# Patient Record
Sex: Female | Born: 1980 | Race: Black or African American | Hispanic: No | Marital: Single | Smoking: Current every day smoker
Health system: Southern US, Community
[De-identification: ages and names within clinical notes are randomized; demographics above are authoritative.]

## PROBLEM LIST (undated history)

## (undated) DIAGNOSIS — N63 Unspecified lump in unspecified breast: Secondary | ICD-10-CM

## (undated) DIAGNOSIS — K5090 Crohn's disease, unspecified, without complications: Secondary | ICD-10-CM

## (undated) DIAGNOSIS — I10 Essential (primary) hypertension: Secondary | ICD-10-CM

## (undated) HISTORY — PX: TUBAL LIGATION: SHX77

---

## 2006-06-19 ENCOUNTER — Emergency Department: Payer: Self-pay | Admitting: Emergency Medicine

## 2006-06-19 ENCOUNTER — Other Ambulatory Visit: Payer: Self-pay

## 2007-11-22 IMAGING — CR DG CHEST 2V
1 series · 2 of 2 positions shown · non-contrast
Comparison: none

REASON FOR EXAM: Chest pain
COMMENTS:

PROCEDURE:     DXR - DXR CHEST PA (OR AP) AND LATERAL  - June 19, 2006  [DATE]
RESULT:  The lung fields are clear. No pneumonia, pneumothorax or pleural
effusion is seen. The heart, mediastinal and osseous structures show no
significant abnormalities.

[Series 1: view not recorded · 0.17mm/px · 2 of 2 slices shown]
[im 1/2]
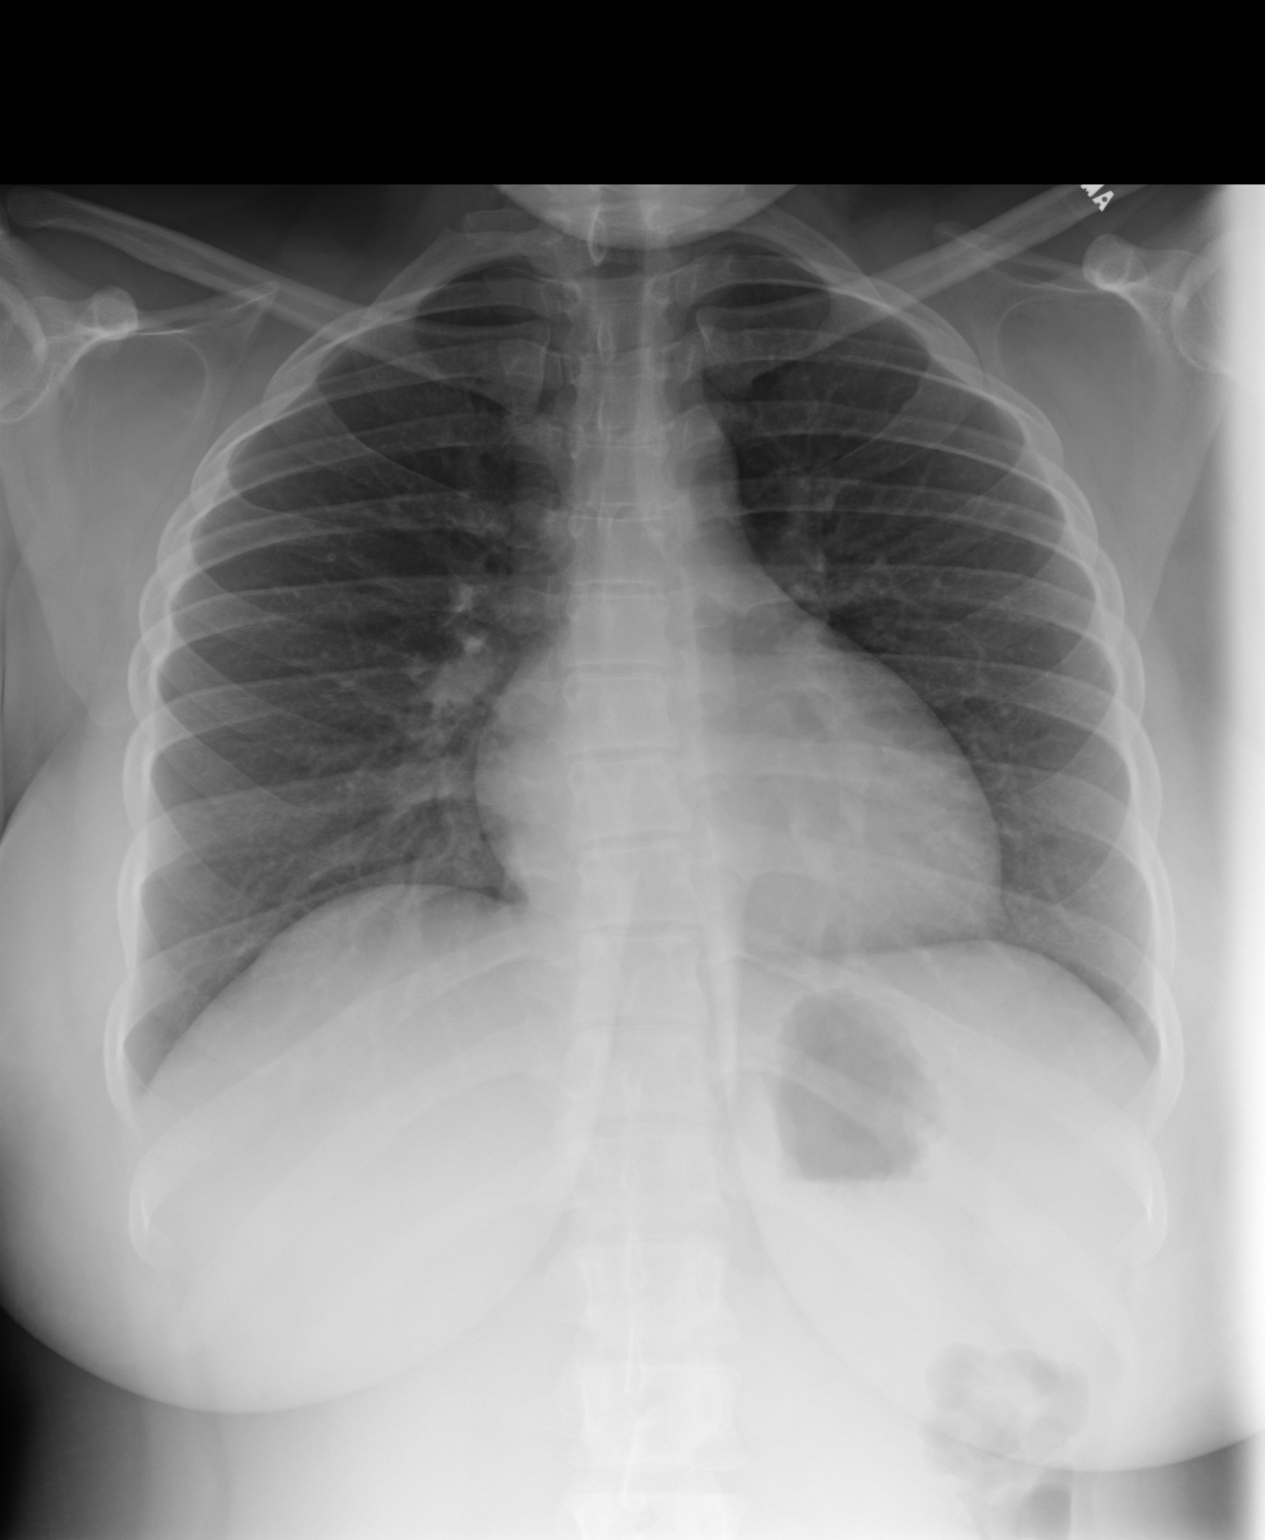
[im 2/2]
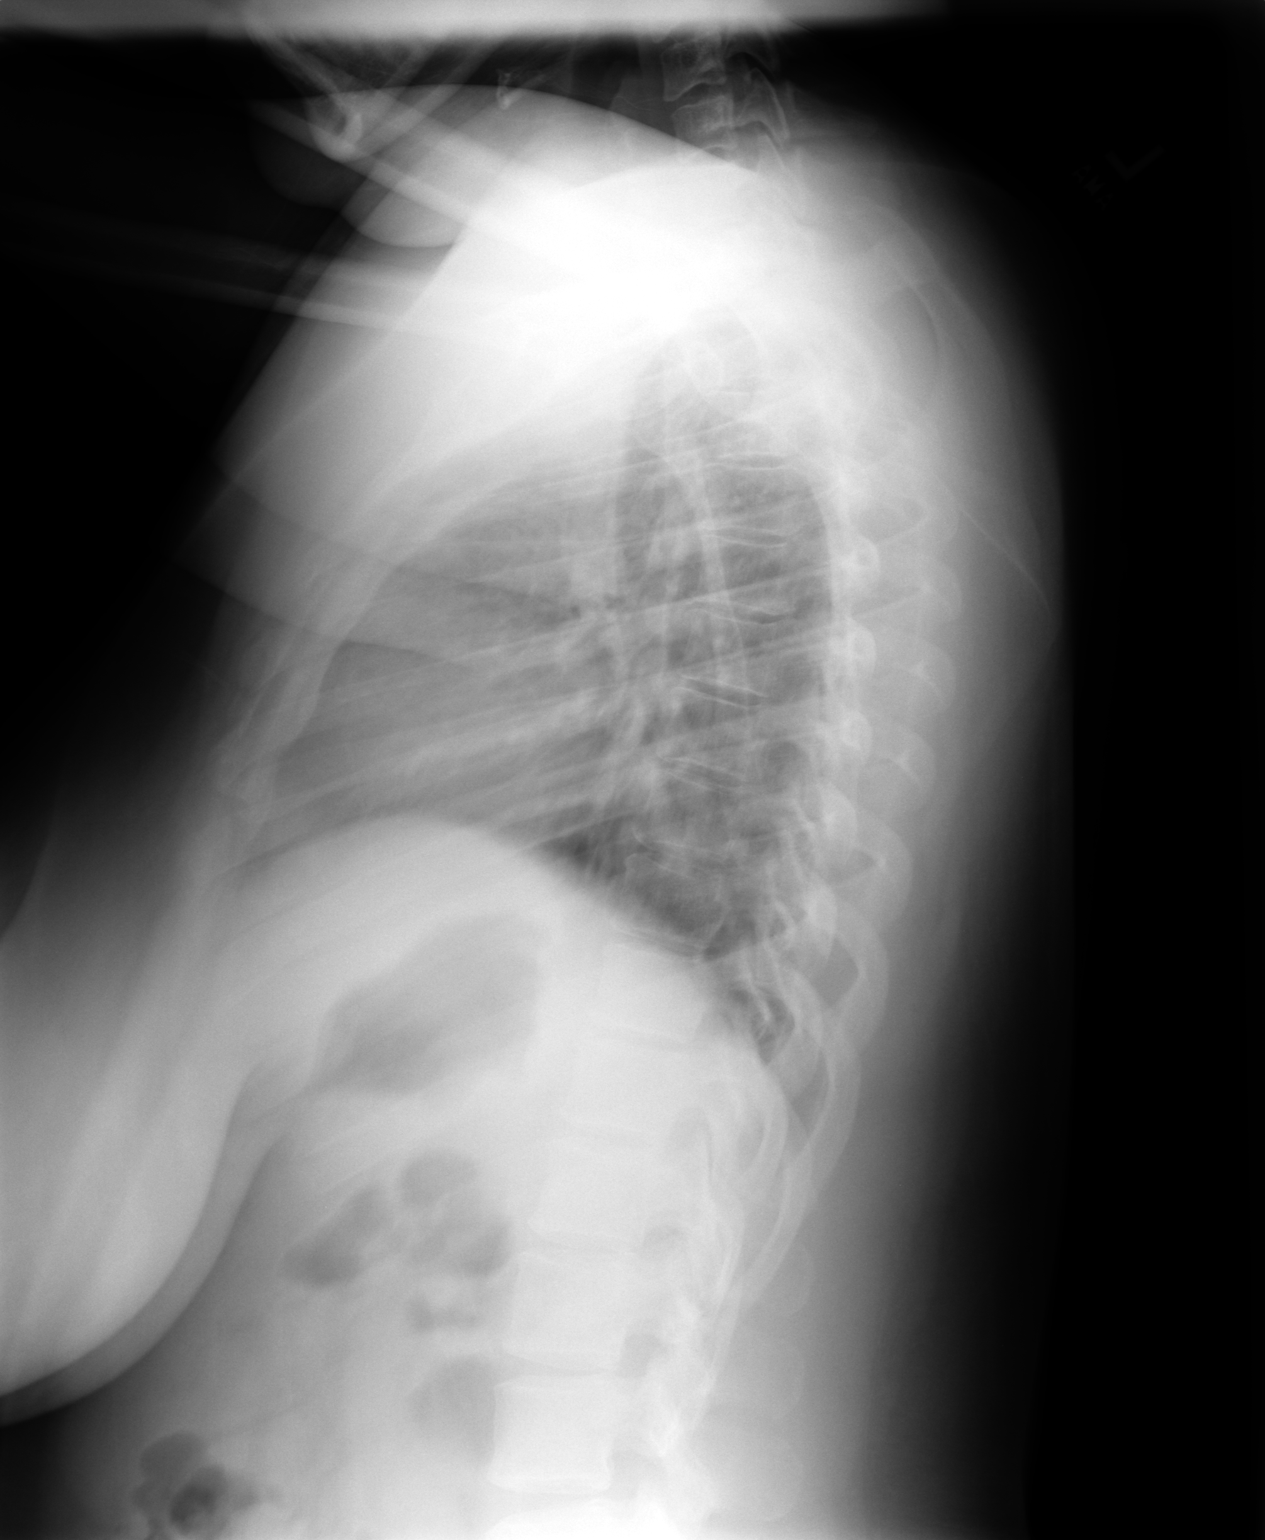

[2 of 2 positions shown; findings below may reference images not displayed]

IMPRESSION: No significant abnormalities are noted.

## 2010-09-01 DIAGNOSIS — D573 Sickle-cell trait: Secondary | ICD-10-CM | POA: Insufficient documentation

## 2010-09-01 DIAGNOSIS — E232 Diabetes insipidus: Secondary | ICD-10-CM | POA: Insufficient documentation

## 2011-12-05 ENCOUNTER — Emergency Department: Payer: Self-pay | Admitting: Emergency Medicine

## 2013-05-09 IMAGING — CR DG HIP COMPLETE 2+V*L*
1 series · 2 of 2 positions shown · non-contrast
Comparison: none

REASON FOR EXAM: pain   flex 6
COMMENTS:

PROCEDURE:     DXR - DXR HIP LEFT COMPLETE  - December 05, 2011  [DATE]
RESULT:     No evidence of fracture or dislocation. Bony density is noted
adjacent to the greater trochanter. These may represent loose bodies or site
of old injury.

[Series 1: ap · 0.17mm/px · 2 of 2 slices shown]
[im 1/2]
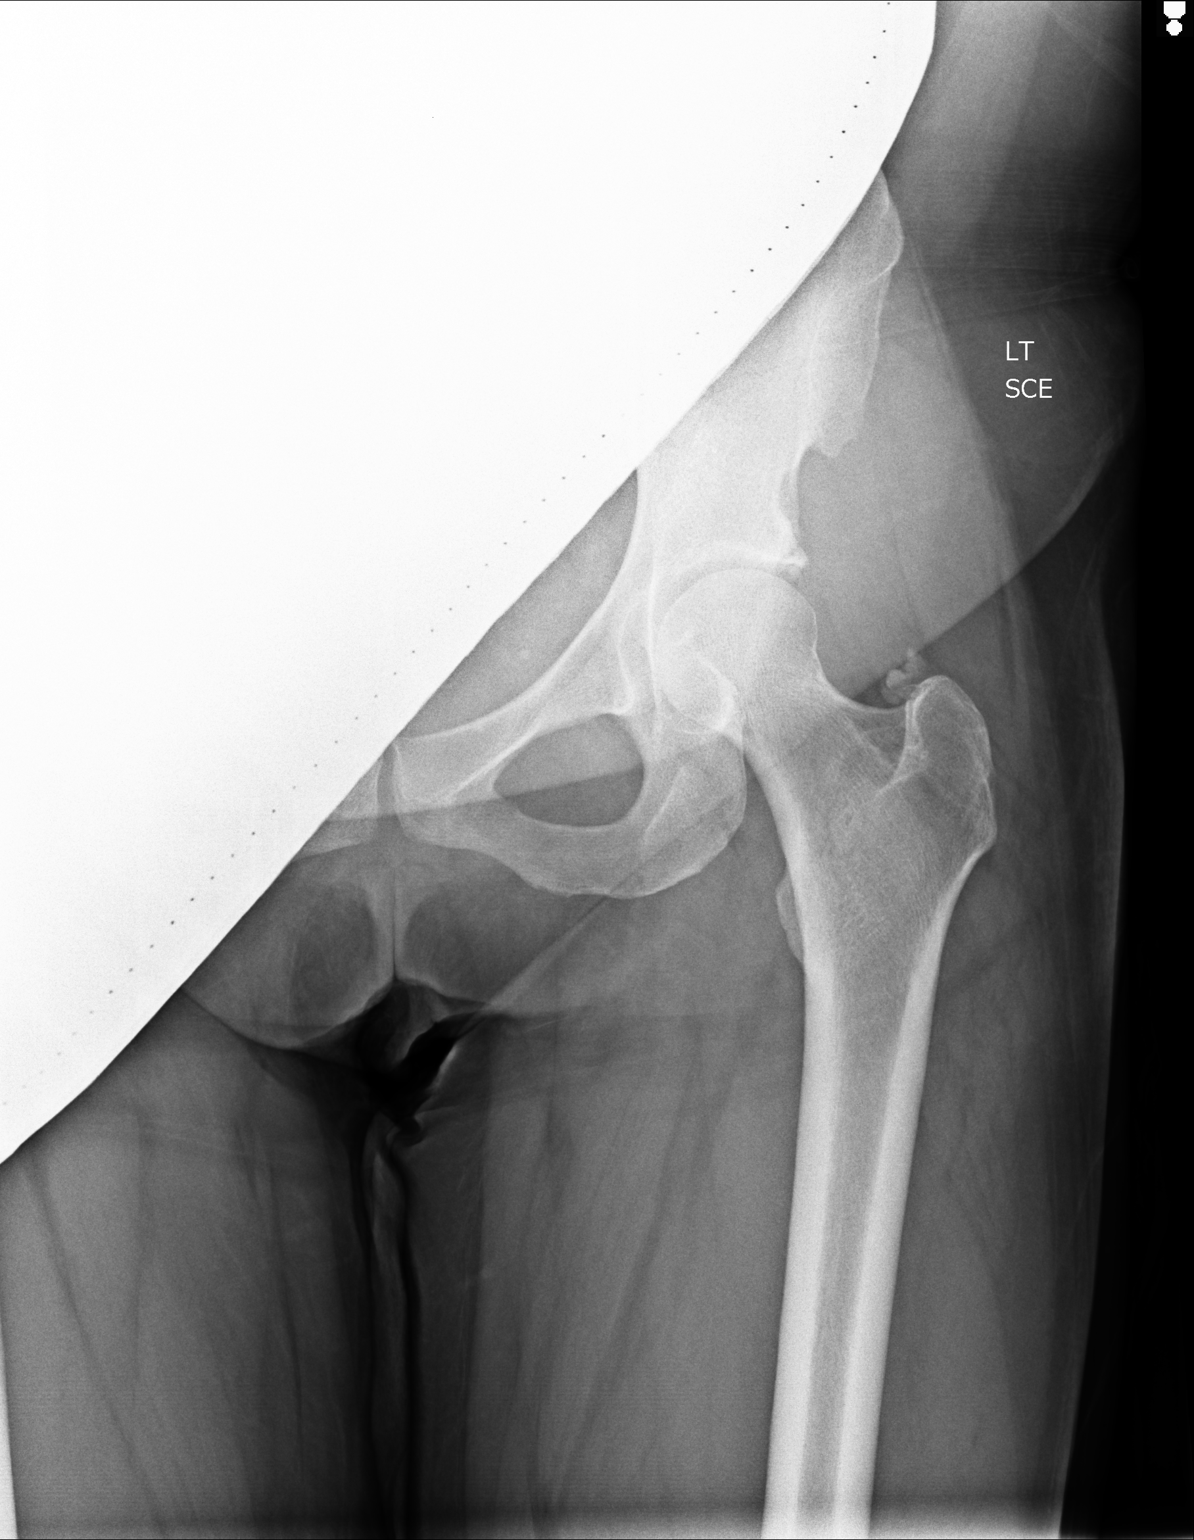
[im 2/2]
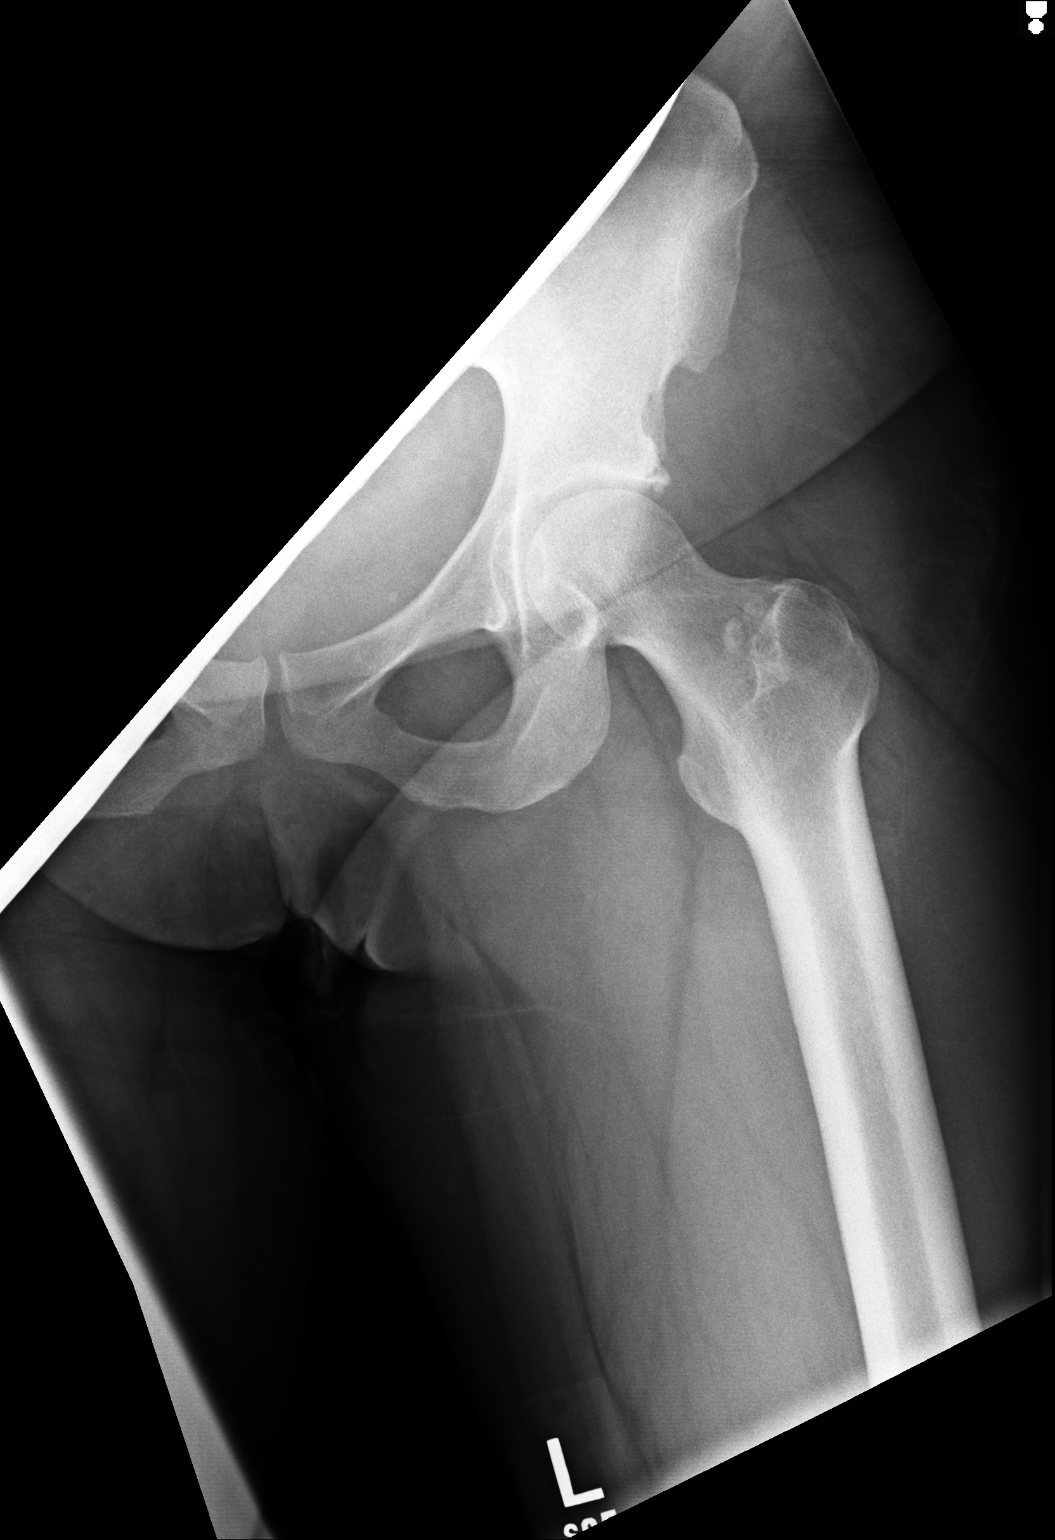

[2 of 2 positions shown; findings below may reference images not displayed]

IMPRESSION: No acute abnormality.

## 2015-01-10 DIAGNOSIS — Z113 Encounter for screening for infections with a predominantly sexual mode of transmission: Secondary | ICD-10-CM | POA: Insufficient documentation

## 2015-01-12 DIAGNOSIS — E785 Hyperlipidemia, unspecified: Secondary | ICD-10-CM | POA: Insufficient documentation

## 2016-04-07 DIAGNOSIS — G5793 Unspecified mononeuropathy of bilateral lower limbs: Secondary | ICD-10-CM | POA: Insufficient documentation

## 2016-04-07 DIAGNOSIS — E1142 Type 2 diabetes mellitus with diabetic polyneuropathy: Secondary | ICD-10-CM | POA: Insufficient documentation

## 2019-01-09 ENCOUNTER — Ambulatory Visit: Payer: Self-pay

## 2019-03-26 DIAGNOSIS — D573 Sickle-cell trait: Secondary | ICD-10-CM

## 2019-03-26 DIAGNOSIS — E232 Diabetes insipidus: Secondary | ICD-10-CM

## 2019-03-27 ENCOUNTER — Other Ambulatory Visit: Payer: Self-pay

## 2019-03-27 ENCOUNTER — Encounter: Payer: Self-pay | Admitting: Physician Assistant

## 2019-03-27 ENCOUNTER — Ambulatory Visit: Payer: Self-pay | Admitting: Physician Assistant

## 2019-03-27 DIAGNOSIS — Z113 Encounter for screening for infections with a predominantly sexual mode of transmission: Secondary | ICD-10-CM

## 2019-03-27 LAB — WET PREP FOR TRICH, YEAST, CLUE
Trichomonas Exam: NEGATIVE
Yeast Exam: NEGATIVE

## 2019-03-27 NOTE — Progress Notes (Signed)
    STI clinic/screening visit  Subjective:  Heather Morse is a 38 y.o. female being seen today for an STI screening visit. The patient reports they do not have symptoms.  Patient has the following medical conditions:   Patient Active Problem List   Diagnosis Date Noted  . Sickle-cell trait ([place]) 09/01/2010  . 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 SEXUALLY TRANSMITTED DISEASE    HPI  Patient reports that she was treated for Trich with 7 days of medicine about 3 weeks ago.  After taking the medicine she had some itching and used an OTC yeast treatment and Vagisil to resolve symptoms.  Would like to have a screening to make sure that things are back to normal.   See flowsheet for further details and programmatic requirements.    The following portions of the patient's history were reviewed and updated as appropriate: allergies, current medications, past medical history, past social history, past surgical history and problem list.  Objective:  There were no vitals filed for this visit.  Physical Exam Constitutional:      General: She is not in acute distress.    Appearance: Normal appearance.  HENT:     Mouth/Throat:     Mouth: Mucous membranes are moist.     Pharynx: Oropharynx is clear. No oropharyngeal exudate or posterior oropharyngeal erythema.  Neck:     Musculoskeletal: Neck supple. No muscular tenderness.  Pulmonary:     Effort: Pulmonary effort is normal.  Abdominal:     General: There is no distension.     Palpations: Abdomen is soft. There is no mass.     Tenderness: There is no abdominal tenderness. There is no guarding.  Genitourinary:    General: Normal vulva.     Pubic Area: No rash.      Labia:        Right: No rash.        Left: No rash.      Vagina: Vaginal discharge present.     Comments: Thin, watery discharge present. No odor detected. PH<4.5; Uterus firm, mobile, non-tender. No CMT nor adenexal masses  upon bimanual exam. External genitalia showed no signs of lesions, rashes, erythema, edema, lice, or nits. Lymphadenopathy:     Cervical: No cervical adenopathy.  Skin:    General: Skin is warm and dry.     Findings: No lesion or rash.  Neurological:     Mental Status: She is alert and oriented to person, place, and time.  Psychiatric:        Mood and Affect: Mood normal.        Behavior: Behavior normal.    Patient exam done by Harlow Mares, FNP student in my presence.   Assessment and Plan:  Heather Morse is a 38 y.o. female presenting to the [facility] Department for STI screening  1. Screening for STD (sexually transmitted disease) Patient is without symptoms today.   Rec condoms with all sex. Await test results.  Counseled that RN will call if needs to RTC for treatment once results are back.  - WET PREP FOR [place], YEAST, [place] - Syphilis Serology, [place] [facility]     No follow-ups on file.  No future appointments.  Jerene Dilling, PA

## 2019-03-27 NOTE — Progress Notes (Signed)
Wet mount reviewed, no treatment indicated at this time.Talisha Erby Brewer-Jensen, RN 

## 2019-03-27 NOTE — Progress Notes (Signed)
Patient states she was seen at urgent care 03/03/2019 and was treated for Trich. Patient states she all her medication and then used yeast cream for continued irritation and itching. States she feels better today, but wants to make sure no continuing infections.Jenetta Downer, RN

## 2020-12-06 ENCOUNTER — Other Ambulatory Visit: Payer: Self-pay | Admitting: Family Medicine

## 2020-12-06 DIAGNOSIS — Z1231 Encounter for screening mammogram for malignant neoplasm of breast: Secondary | ICD-10-CM

## 2020-12-06 DIAGNOSIS — Z8619 Personal history of other infectious and parasitic diseases: Secondary | ICD-10-CM | POA: Insufficient documentation

## 2020-12-14 ENCOUNTER — Other Ambulatory Visit: Payer: Self-pay

## 2020-12-14 ENCOUNTER — Ambulatory Visit
Admission: RE | Admit: 2020-12-14 | Discharge: 2020-12-14 | Disposition: A | Discharge status: Home / Self Care | Payer: Commercial Managed Care - PPO | Source: Ambulatory Visit | Attending: Family Medicine | Admitting: Family Medicine

## 2020-12-14 DIAGNOSIS — Z1231 Encounter for screening mammogram for malignant neoplasm of breast: Secondary | ICD-10-CM | POA: Insufficient documentation

## 2020-12-15 ENCOUNTER — Other Ambulatory Visit: Payer: Self-pay | Admitting: Family Medicine

## 2020-12-15 DIAGNOSIS — Z1231 Encounter for screening mammogram for malignant neoplasm of breast: Secondary | ICD-10-CM

## 2020-12-20 ENCOUNTER — Other Ambulatory Visit: Payer: Self-pay

## 2020-12-20 ENCOUNTER — Ambulatory Visit
Admission: RE | Admit: 2020-12-20 | Discharge: 2020-12-20 | Disposition: A | Discharge status: Home / Self Care | Payer: Commercial Managed Care - PPO | Source: Ambulatory Visit | Attending: Family Medicine | Admitting: Family Medicine

## 2020-12-20 DIAGNOSIS — Z1231 Encounter for screening mammogram for malignant neoplasm of breast: Secondary | ICD-10-CM | POA: Insufficient documentation

## 2020-12-20 DIAGNOSIS — Z803 Family history of malignant neoplasm of breast: Secondary | ICD-10-CM | POA: Insufficient documentation

## 2020-12-20 DIAGNOSIS — N63 Unspecified lump in unspecified breast: Secondary | ICD-10-CM | POA: Insufficient documentation

## 2020-12-20 HISTORY — DX: Unspecified lump in unspecified breast: N63.0

## 2020-12-22 ENCOUNTER — Other Ambulatory Visit: Payer: Self-pay | Admitting: Family Medicine

## 2020-12-22 DIAGNOSIS — N6320 Unspecified lump in the left breast, unspecified quadrant: Secondary | ICD-10-CM

## 2021-12-24 ENCOUNTER — Emergency Department: Payer: Commercial Managed Care - PPO

## 2021-12-24 ENCOUNTER — Other Ambulatory Visit: Payer: Self-pay

## 2021-12-24 ENCOUNTER — Emergency Department
Admission: EM | Admit: 2021-12-24 | Discharge: 2021-12-24 | Disposition: A | Discharge status: Home / Self Care | Payer: Commercial Managed Care - PPO | Attending: Emergency Medicine | Admitting: Emergency Medicine

## 2021-12-24 DIAGNOSIS — F1010 Alcohol abuse, uncomplicated: Secondary | ICD-10-CM

## 2021-12-24 DIAGNOSIS — I10 Essential (primary) hypertension: Secondary | ICD-10-CM | POA: Diagnosis not present

## 2021-12-24 DIAGNOSIS — I700 Atherosclerosis of aorta: Secondary | ICD-10-CM | POA: Diagnosis not present

## 2021-12-24 DIAGNOSIS — R1084 Generalized abdominal pain: Secondary | ICD-10-CM | POA: Diagnosis not present

## 2021-12-24 DIAGNOSIS — R197 Diarrhea, unspecified: Secondary | ICD-10-CM

## 2021-12-24 DIAGNOSIS — K529 Noninfective gastroenteritis and colitis, unspecified: Secondary | ICD-10-CM | POA: Diagnosis not present

## 2021-12-24 LAB — COMPREHENSIVE METABOLIC PANEL
ALT: 21 U/L (ref 0–44)
AST: 19 U/L (ref 15–41)
Albumin: 4.2 g/dL (ref 3.5–5.0)
Alkaline Phosphatase: 64 U/L (ref 38–126)
Anion gap: 9 (ref 5–15)
BUN: 5 mg/dL — ABNORMAL LOW (ref 6–20)
CO2: 21 mmol/L — ABNORMAL LOW (ref 22–32)
Calcium: 9.5 mg/dL (ref 8.9–10.3)
Chloride: 106 mmol/L (ref 98–111)
Creatinine, Ser: 0.47 mg/dL (ref 0.44–1.00)
GFR, Estimated: >60 mL/min (ref >60)
Glucose, Bld: 159 mg/dL — ABNORMAL HIGH (ref 70–99)
Potassium: 3.4 mmol/L — ABNORMAL LOW (ref 3.5–5.1)
Sodium: 136 mmol/L (ref 135–145)
Total Bilirubin: 1.1 mg/dL (ref 0.3–1.2)
Total Protein: 8.2 g/dL — ABNORMAL HIGH (ref 6.5–8.1)

## 2021-12-24 LAB — GASTROINTESTINAL PANEL BY PCR, STOOL (REPLACES STOOL CULTURE)

## 2021-12-24 LAB — URINALYSIS, COMPLETE (UACMP) WITH MICROSCOPIC
Bilirubin Urine: NEGATIVE
Glucose, UA: NEGATIVE mg/dL
Ketones, ur: 20 mg/dL — AB
Leukocytes,Ua: NEGATIVE
Nitrite: NEGATIVE
Protein, ur: NEGATIVE mg/dL
Specific Gravity, Urine: 1.008 (ref 1.005–1.030)
pH: 5 (ref 5.0–8.0)

## 2021-12-24 LAB — CBC WITH DIFFERENTIAL/PLATELET
Abs Immature Granulocytes: 0.02 10*3/uL (ref 0.00–0.07)
Basophils Absolute: 0 10*3/uL (ref 0.0–0.1)
Basophils Relative: 1 %
Eosinophils Absolute: 0.1 10*3/uL (ref 0.0–0.5)
Eosinophils Relative: 1 %
HCT: 42.4 % (ref 36.0–46.0)
Hemoglobin: 14 g/dL (ref 12.0–15.0)
Immature Granulocytes: 0 %
Lymphocytes Relative: 33 %
Lymphs Abs: 2.1 10*3/uL (ref 0.7–4.0)
MCH: 28 pg (ref 26.0–34.0)
MCHC: 33 g/dL (ref 30.0–36.0)
MCV: 84.8 fL (ref 80.0–100.0)
Monocytes Absolute: 1.1 10*3/uL — ABNORMAL HIGH (ref 0.1–1.0)
Monocytes Relative: 17 %
Neutro Abs: 3.1 10*3/uL (ref 1.7–7.7)
Neutrophils Relative %: 48 %
Platelets: 345 10*3/uL (ref 150–400)
RBC: 5 MIL/uL (ref 3.87–5.11)
RDW: 12.2 % (ref 11.5–15.5)
WBC: 6.4 10*3/uL (ref 4.0–10.5)
nRBC: 0 % (ref 0.0–0.2)

## 2021-12-24 LAB — C DIFFICILE QUICK SCREEN W PCR REFLEX
C Diff antigen: NEGATIVE
C Diff interpretation: NOT DETECTED
C Diff toxin: NEGATIVE

## 2021-12-24 LAB — MAGNESIUM: Magnesium: 1.9 mg/dL (ref 1.7–2.4)

## 2021-12-24 LAB — HCG, QUANTITATIVE, PREGNANCY: hCG, Beta Chain, Quant, S: 3 m[IU]/mL (ref <5)

## 2021-12-24 LAB — LIPASE, BLOOD: Lipase: 33 U/L (ref 11–51)

## 2021-12-24 MED ORDER — IOHEXOL 350 MG/ML SOLN
80.0000 mL | Freq: Once | INTRAVENOUS | Status: AC | PRN
  Administered 2021-12-24: 80 mL via INTRAVENOUS

## 2021-12-24 MED ORDER — DICYCLOMINE HCL 10 MG PO CAPS
10.0000 mg | ORAL_CAPSULE | Freq: Once | ORAL | Status: AC
  Administered 2021-12-24: 10 mg via ORAL
  Filled 2021-12-24: qty 1

## 2021-12-24 MED ORDER — AZITHROMYCIN 500 MG PO TABS: 500.0000 mg | ORAL_TABLET | Freq: Every day | ORAL | 0 refills | Status: AC

## 2021-12-24 MED ORDER — POTASSIUM CHLORIDE CRYS ER 20 MEQ PO TBCR
40.0000 meq | EXTENDED_RELEASE_TABLET | Freq: Once | ORAL | Status: AC
  Administered 2021-12-24: 40 meq via ORAL
  Filled 2021-12-24: qty 2

## 2021-12-24 MED ORDER — LACTATED RINGERS IV BOLUS
2000.0000 mL | Freq: Once | INTRAVENOUS | Status: AC
  Administered 2021-12-24: 2000 mL via INTRAVENOUS

## 2021-12-24 MED ORDER — DICYCLOMINE HCL 10 MG PO CAPS: 10.0000 mg | ORAL_CAPSULE | Freq: Three times a day (TID) | ORAL | 0 refills | Status: AC

## 2021-12-24 NOTE — ED Notes (Signed)
Pt states she has had diarrhea and lower abd pain since Sunday- pt denies n/v and fever- pt states the diarrhea is constant- pt states she did notice a little blood in the diarrhea today, but not much- pt states she attempted to provide urine but was unable to

## 2021-12-24 NOTE — ED Notes (Signed)
Called CT to find out ETA on scan for pt, states they will come to get her now

## 2021-12-24 NOTE — ED Triage Notes (Signed)
Suprapubic abdominal pain with diarrhea for 1 week. Denies fever.

## 2022-05-25 IMAGING — MG DIGITAL DIAGNOSTIC BILAT W/ TOMO W/ CAD
6 of 12 series · 6 of 36 positions shown · non-contrast
Comparison: None.

CLINICAL DATA: 39-year-old female presenting with a new lump in the
left breast for approximately 2 months. Family history of breast
cancer in the patient's mother at age 48.

EXAM:
DIGITAL DIAGNOSTIC BILATERAL MAMMOGRAM WITH TOMOSYNTHESIS AND CAD;
ULTRASOUND LEFT BREAST LIMITED
TECHNIQUE: Bilateral digital diagnostic mammography and breast tomosynthesis
was performed. The images were evaluated with computer-aided
detection.; Targeted ultrasound examination of the left breast was
performed

[R MLO synth-2D]
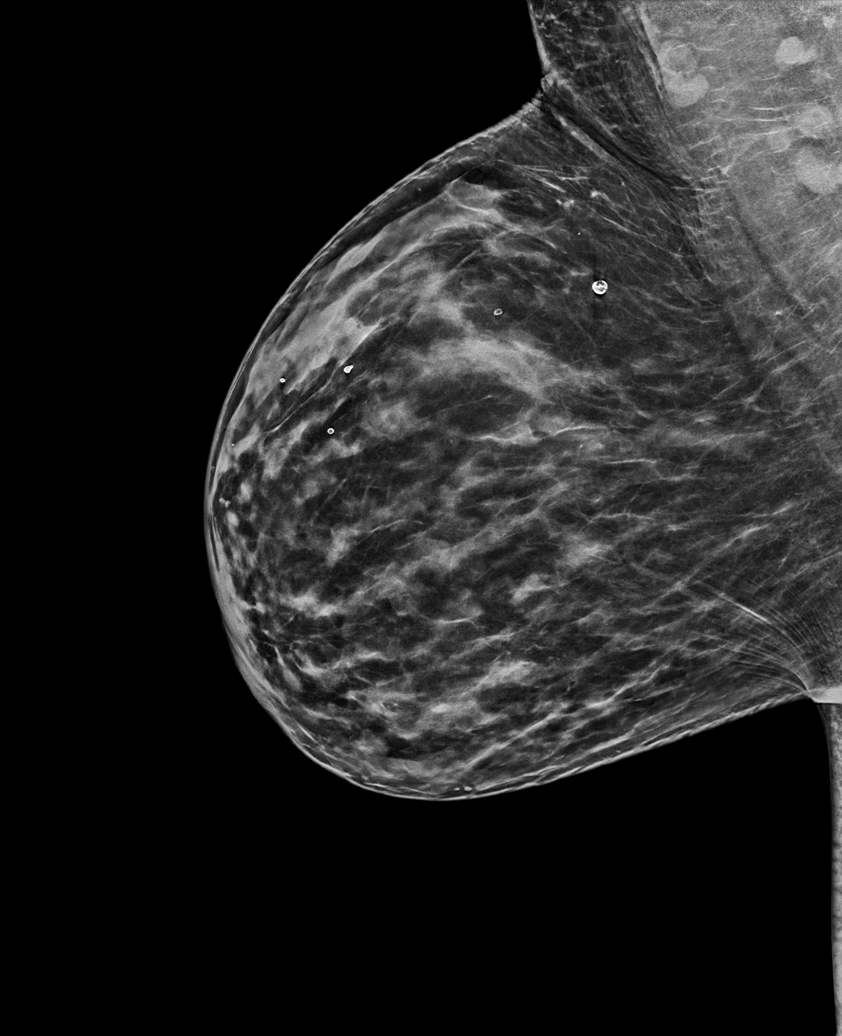

[L MLO synth-2D (1 of 3)]
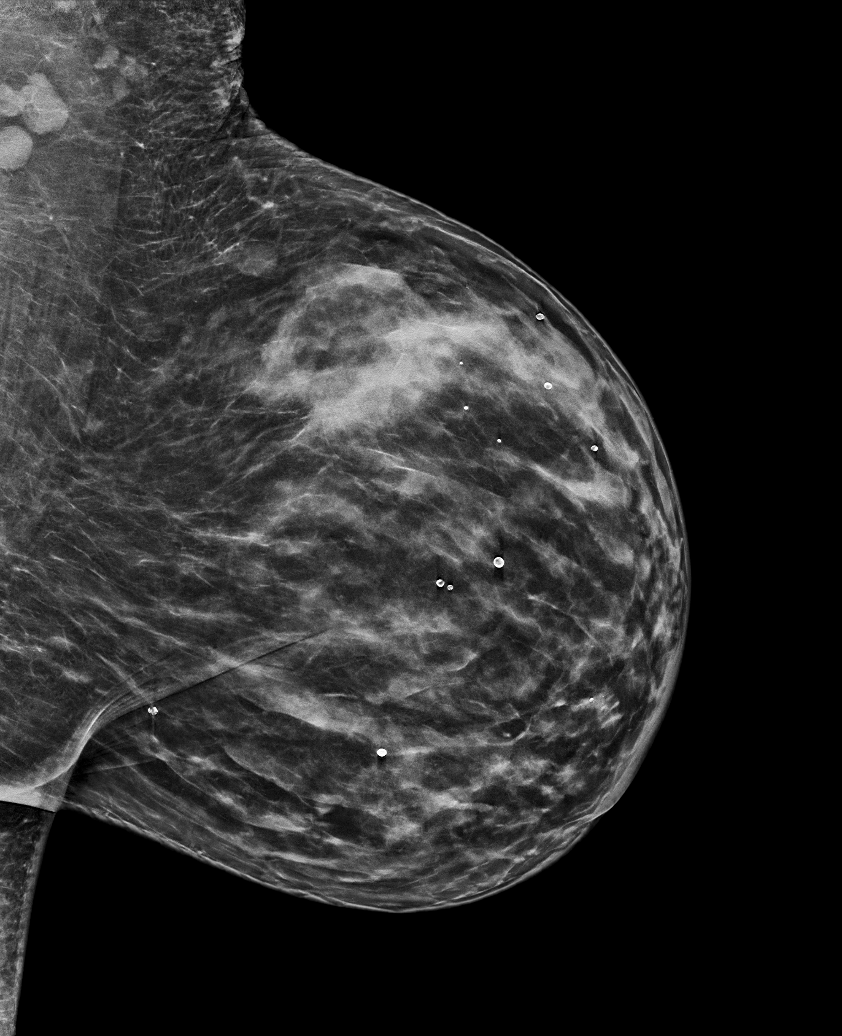

[L MLO synth-2D (2 of 3)]
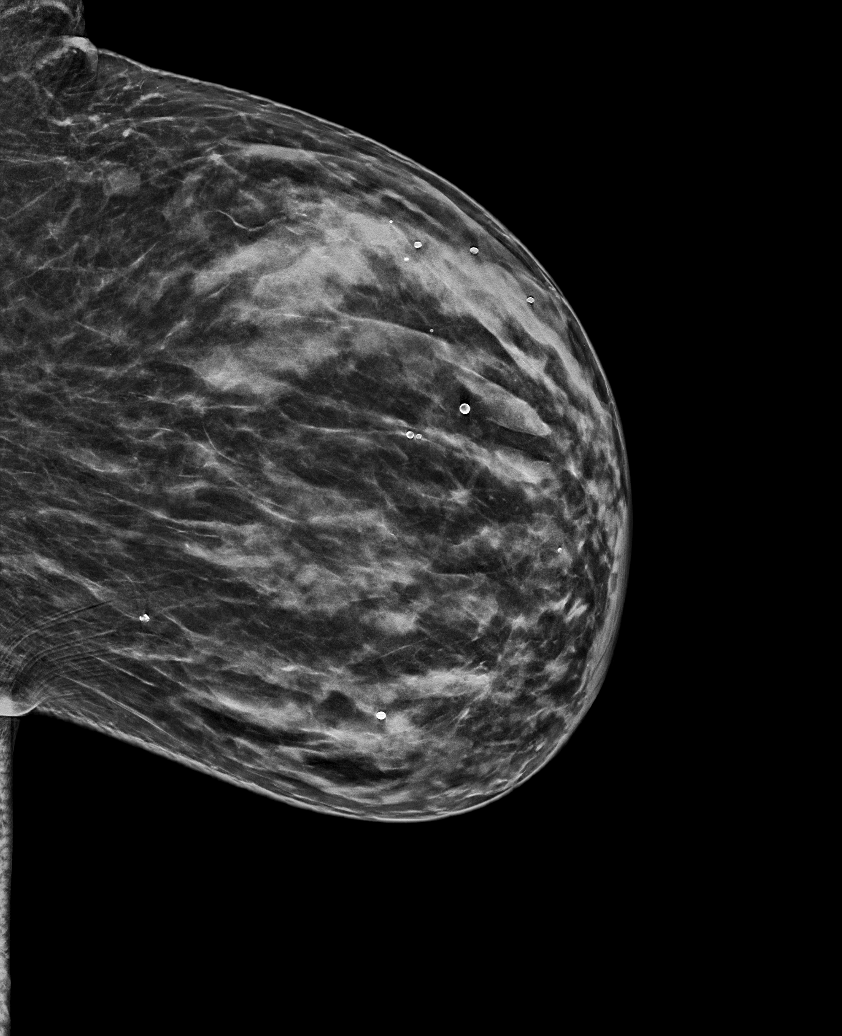

[R CC synth-2D]
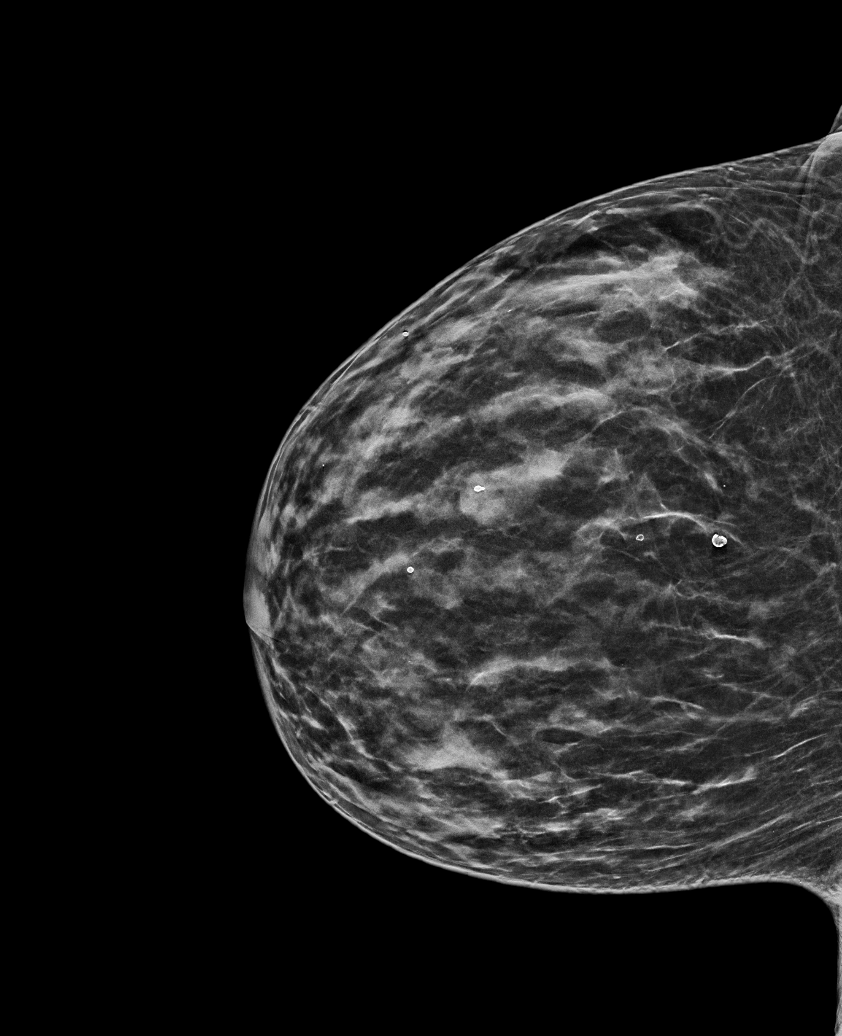

[L CC synth-2D]
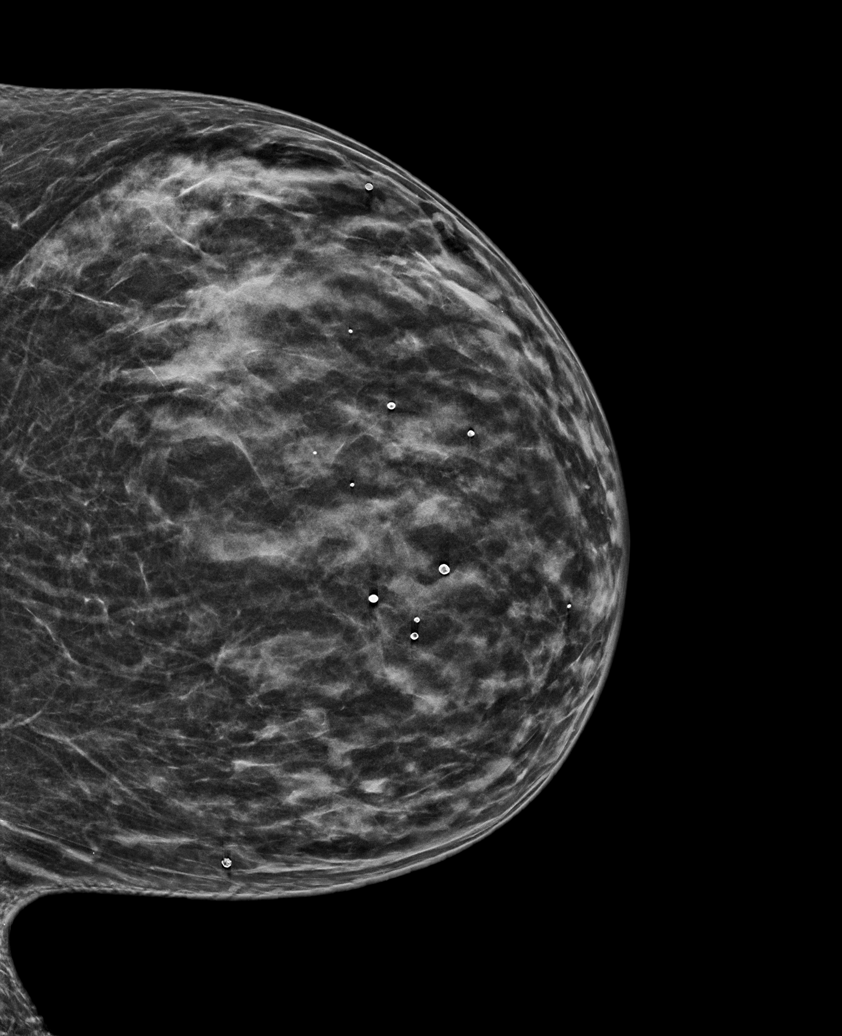

[L MLO synth-2D (3 of 3)]
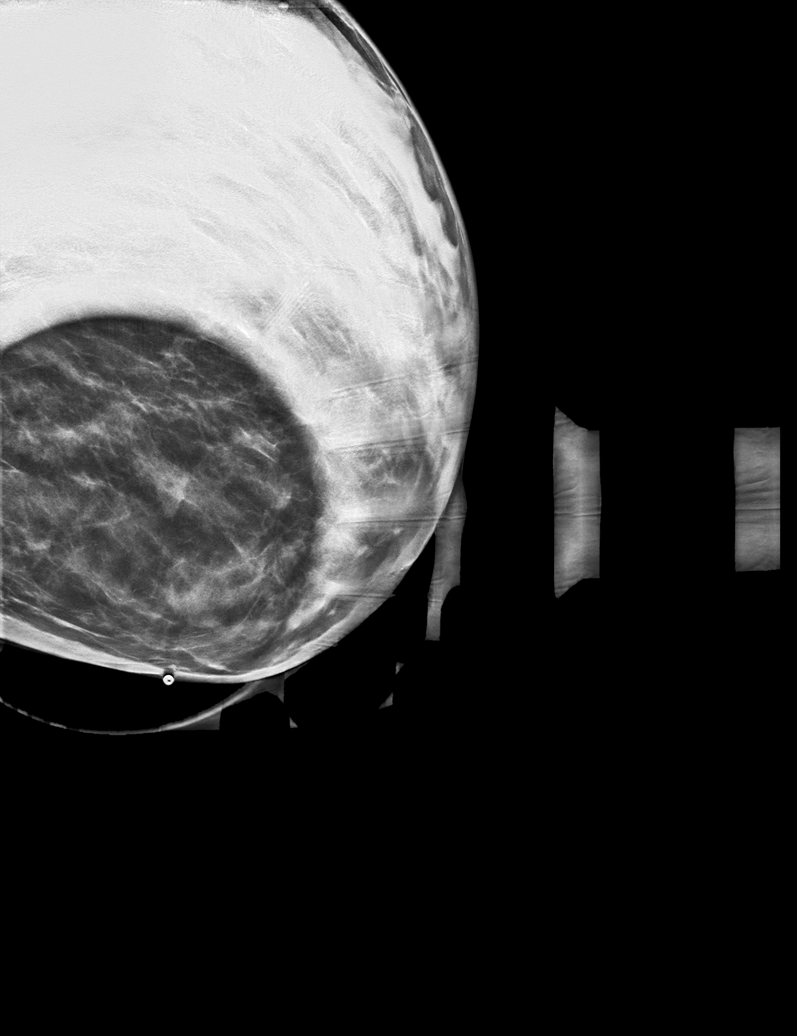

[6 of 36 positions shown; findings below may reference images not displayed]

ACR Breast Density Category c: The breast tissue is heterogeneously
dense, which may obscure small masses.
FINDINGS: Mammogram:

Right breast: No suspicious mass, distortion, or microcalcifications
are identified to suggest presence of malignancy.

Left breast: A skin BB marks the site of concern reported by the
patient in the central inferior left breast. A spot tangential view
of this area is performed in addition to standard views. There is no
abnormality at the palpable site. In the upper outer left breast
posterior depth there is an oval circumscribed mass measuring
approximately 1.0 cm.

On physical exam at site of concern reported by the patient in the
central inferior left breast I feel a ridge of tissue but no
discrete mass.

Ultrasound:

Targeted ultrasound performed at the site of concern reported by the
patient in the left breast at 4 o'clock 6 cm from the nipple
demonstrating no cystic or solid mass.

Targeted ultrasound performed in the left breast at 2 o'clock 16 cm
from nipple demonstrating an oval circumscribed hypoechoic mass
measuring 1.0 x 0.4 x 0.9 cm, possibly a complicated cyst or
fibroadenoma. No internal vascularity.
IMPRESSION: 1. No mammographic or sonographic evidence of malignancy at the
palpable site of concern in the left breast.

2. Probably benign mass in the left breast at 2 o'clock measuring
1.0 cm, likely a fibroadenoma or complicated cyst.

RECOMMENDATION:
1.Left breast ultrasound in 6 months.

2. Consider risk assessment to determine the patient's lifetime risk
of breast cancer given her family history, if this has not already
been performed. Per American Cancer Society guidelines, if the
patient has a calculated lifetime risk of developing breast cancer
of greater than 20%, annual screening MRI of the breasts would be
recommended at the time of screening mammography.

I have discussed the findings and recommendations with the patient
who agrees to proceed with short-term follow-up.

BI-RADS CATEGORY  3: Probably benign.

## 2023-02-17 ENCOUNTER — Ambulatory Visit: Payer: Self-pay

## 2023-02-19 ENCOUNTER — Ambulatory Visit
Admission: EM | Admit: 2023-02-19 | Discharge: 2023-02-19 | Payer: Commercial Managed Care - PPO | Attending: Family Medicine | Admitting: Family Medicine

## 2023-02-19 DIAGNOSIS — D72825 Bandemia: Secondary | ICD-10-CM

## 2023-02-19 DIAGNOSIS — A599 Trichomoniasis, unspecified: Secondary | ICD-10-CM

## 2023-02-19 DIAGNOSIS — R1084 Generalized abdominal pain: Secondary | ICD-10-CM

## 2023-02-19 DIAGNOSIS — R197 Diarrhea, unspecified: Secondary | ICD-10-CM | POA: Diagnosis not present

## 2023-02-19 DIAGNOSIS — F17210 Nicotine dependence, cigarettes, uncomplicated: Secondary | ICD-10-CM | POA: Diagnosis not present

## 2023-02-19 DIAGNOSIS — D509 Iron deficiency anemia, unspecified: Secondary | ICD-10-CM | POA: Diagnosis present

## 2023-02-19 DIAGNOSIS — Z1152 Encounter for screening for COVID-19: Secondary | ICD-10-CM | POA: Insufficient documentation

## 2023-02-19 DIAGNOSIS — F1010 Alcohol abuse, uncomplicated: Secondary | ICD-10-CM | POA: Diagnosis not present

## 2023-02-19 DIAGNOSIS — R000 Tachycardia, unspecified: Secondary | ICD-10-CM

## 2023-02-19 LAB — CBC WITH DIFFERENTIAL/PLATELET
Abs Immature Granulocytes: 0.15 10*3/uL — ABNORMAL HIGH (ref 0.00–0.07)
Basophils Absolute: 0.1 10*3/uL (ref 0.0–0.1)
Basophils Relative: 0 %
Eosinophils Absolute: 0 10*3/uL (ref 0.0–0.5)
Eosinophils Relative: 0 %
HCT: 33.5 % — ABNORMAL LOW (ref 36.0–46.0)
Hemoglobin: 11.4 g/dL — ABNORMAL LOW (ref 12.0–15.0)
Immature Granulocytes: 1 %
Lymphocytes Relative: 11 %
Lymphs Abs: 2 10*3/uL (ref 0.7–4.0)
MCH: 26.9 pg (ref 26.0–34.0)
MCHC: 34 g/dL (ref 30.0–36.0)
MCV: 79 fL — ABNORMAL LOW (ref 80.0–100.0)
Monocytes Absolute: 1.5 10*3/uL — ABNORMAL HIGH (ref 0.1–1.0)
Monocytes Relative: 8 %
Neutro Abs: 14.7 10*3/uL — ABNORMAL HIGH (ref 1.7–7.7)
Neutrophils Relative %: 80 %
Platelets: 605 10*3/uL — ABNORMAL HIGH (ref 150–400)
RBC: 4.24 MIL/uL (ref 3.87–5.11)
RDW: 13.2 % (ref 11.5–15.5)
WBC: 18.5 10*3/uL — ABNORMAL HIGH (ref 4.0–10.5)
nRBC: 0 % (ref 0.0–0.2)

## 2023-02-19 LAB — URINALYSIS, W/ REFLEX TO CULTURE (INFECTION SUSPECTED)
Glucose, UA: NEGATIVE mg/dL
Ketones, ur: 40 mg/dL — AB
Nitrite: NEGATIVE
Protein, ur: 100 mg/dL — AB
Specific Gravity, Urine: 1.025 (ref 1.005–1.030)
pH: 5.5 (ref 5.0–8.0)

## 2023-02-19 LAB — COMPREHENSIVE METABOLIC PANEL
ALT: 49 U/L — ABNORMAL HIGH (ref 0–44)
AST: 27 U/L (ref 15–41)
Albumin: 3.2 g/dL — ABNORMAL LOW (ref 3.5–5.0)
Alkaline Phosphatase: 168 U/L — ABNORMAL HIGH (ref 38–126)
Anion gap: 14 (ref 5–15)
BUN: 8 mg/dL (ref 6–20)
CO2: 21 mmol/L — ABNORMAL LOW (ref 22–32)
Calcium: 8.7 mg/dL — ABNORMAL LOW (ref 8.9–10.3)
Chloride: 97 mmol/L — ABNORMAL LOW (ref 98–111)
Creatinine, Ser: 0.57 mg/dL (ref 0.44–1.00)
GFR, Estimated: >60 mL/min (ref >60)
Glucose, Bld: 167 mg/dL — ABNORMAL HIGH (ref 70–99)
Potassium: 3.4 mmol/L — ABNORMAL LOW (ref 3.5–5.1)
Sodium: 132 mmol/L — ABNORMAL LOW (ref 135–145)
Total Bilirubin: 1.4 mg/dL — ABNORMAL HIGH (ref 0.3–1.2)
Total Protein: 8.7 g/dL — ABNORMAL HIGH (ref 6.5–8.1)

## 2023-02-19 LAB — LIPASE, BLOOD: Lipase: 34 U/L (ref 11–51)

## 2023-02-19 LAB — SARS CORONAVIRUS 2 BY RT PCR: SARS Coronavirus 2 by RT PCR: NEGATIVE

## 2023-02-19 MED ORDER — METRONIDAZOLE 500 MG PO TABS: 500.0000 mg | ORAL_TABLET | Freq: Two times a day (BID) | ORAL | 0 refills | Status: DC

## 2023-02-19 NOTE — ED Provider Notes (Addendum)
[facility]    CSN: [id number] Arrival date & time: 02/19/23  7829      History   Chief Complaint Chief Complaint  Patient presents with   Fatigue   Chills   Loss of appetite    HPI Heather Morse is a 42 y.o. female.   HPI  History obtained from the patient. Heather Morse presents for fatigue, diarrhea, chills and loss of appetite that started a week ago.  Has abdominal pain with diarrhea for months.  Non-bloody diarrhea nearly every time she goes to the bathroom  She didn't drink may fluids yesterday. Only ate a bowl for fruit yesterday.  Yesterday, she was very tired and rested all day. 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  The nurse told her she had a low grade temperature. The nurse told her she needed to be seen somewhere.   Notes she drinks 3-24 oz beers daily but hasn't drank alcohol in a week.    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      Home Medications    Prior to Admission  medications   Medication Sig Start Date End Date Taking? Authorizing Provider  metroNIDAZOLE (FLAGYL) 500 MG tablet Take 1 tablet (500 mg total) by mouth 2 (two) times daily. 02/19/23  Yes Adriyana Greenbaum, DO  dicyclomine (BENTYL) 10 MG capsule Take 1 capsule (10 mg total) by mouth 4 (four) times daily -  before meals and at bedtime for 3 days. 12/24/21 12/27/21  Gilles Chiquito, MD    Family History Family History  Problem Relation Age of Onset   Breast cancer Mother 34   Breast cancer Paternal Grandmother     Social History Social History   Tobacco Use   Smoking status: Every Day    Current packs/day: 0.75    Average packs/day: 0.8 packs/day for 20.0 years (15.0 ttl pk-yrs)    Types: Cigarettes   Smokeless tobacco: Never     Allergies   Metformin   Review of Systems Review of Systems: negative unless otherwise stated in HPI.      Physical Exam Triage Vital Signs ED Triage Vitals  Encounter Vitals Group     BP 02/19/23 0942 122/88     Systolic BP Percentile --      Diastolic BP Percentile --      Pulse Rate 02/19/23 0942 (!) 115     Resp 02/19/23 0942 16  Temp 02/19/23 0942 99.8 F (37.7 C)     Temp Source 02/19/23 0942 Oral     SpO2 02/19/23 0942 98 %     Weight 02/19/23 0941 150 lb (68 kg)     Height 02/19/23 0941 5\' 2"  (1.575 m)     Head Circumference --      Peak Flow --      Pain Score 02/19/23 0948 0     Pain Loc --      Pain Education --      Exclude from Growth Chart --    No data found.  Updated Vital Signs BP 122/88 (BP Location: Right Arm)   Pulse (!) 115   Temp 99.8 F (37.7 C) (Oral)   Resp 16   Ht 5\' 2"  (1.575 m)   Wt 68 kg   SpO2 98%   BMI 27.44 kg/m   Visual Acuity Right Eye Distance:   Left Eye Distance:   Bilateral Distance:    Right Eye Near:   Left Eye Near:    Bilateral Near:     Physical Exam GEN:     alert, ill but non-toxic appearing female in no distress    HENT:  mucus membranes moist, no nasal discharge,   EYES:   pupils equal and reactive, no scleral injection or icterus NECK:  normal ROM, no meningismus   RESP:  no increased work of breathing, clear to auscultation bilaterally CVS:   regular rate and rhythm ABD:   Soft, generalized tenderness to light palpation, non distended, no guarding, no rebound, active bowel sounds throughout, no palpable masses  Skin:   warm and dry, no rash on visible skin    UC Treatments / Results  Labs (all labs ordered are listed, but only abnormal results are displayed) Labs Reviewed  CBC WITH DIFFERENTIAL/PLATELET - Abnormal; Notable for the following components:      Result Value   WBC 18.5 (*)    Hemoglobin 11.4 (*)    HCT 33.5 (*)    MCV 79.0 (*)    Platelets 605 (*)    Neutro Abs 14.7 (*)    Monocytes Absolute 1.5 (*)    Abs Immature Granulocytes 0.15 (*)    All other components within normal limits  COMPREHENSIVE METABOLIC PANEL - Abnormal; Notable for the following components:   Sodium 132 (*)    Potassium 3.4 (*)    Chloride 97 (*)    CO2 21 (*)    Glucose, Bld 167 (*)    Calcium 8.7 (*)    Total Protein 8.7 (*)    Albumin 3.2 (*)    ALT 49 (*)    Alkaline Phosphatase 168 (*)    Total Bilirubin 1.4 (*)    All other components within normal limits  URINALYSIS, W/ REFLEX TO CULTURE (INFECTION SUSPECTED) - Abnormal; Notable for the following components:   Color, Urine AMBER (*)    APPearance HAZY (*)    Hgb urine dipstick TRACE (*)    Bilirubin Urine MODERATE (*)    Ketones, ur 40 (*)    Protein, ur 100 (*)    Leukocytes,Ua TRACE (*)    Bacteria, UA FEW (*)    Trichomonas, UA PRESENT (*)    All other components within normal limits  SARS CORONAVIRUS 2 BY RT PCR  LIPASE, BLOOD    EKG   Radiology No results found.  Procedures Procedures (including critical care time)  Medications Ordered in UC Medications - No data to display  Initial Impression / Assessment and Plan / UC Course  I have reviewed the triage vital signs  and the nursing notes.  Pertinent labs & imaging results that were available during my care of the patient were reviewed by me and considered in my medical decision making (see chart for details).       Pt is a 43 y.o. female who presents for chills and fatigue with chronic abdominal pain and diarrhea.   Heather Morse has an elevated temperature here of 99.8 F and is tachycardic. Satting well on room air. Normotensive. Overall pt is ill but non-toxic appearing, well hydrated, without respiratory distress.  EKG showing sinus tachycardia without acute ST or T wave changes, personally interpreted by me.   She drinks ~6-pk beer per day but hasn't drank in about a week.  Denies tremors or hallucinations. Encouraged alcohol reduction and cessation.    On exam, she has generalized abdominal tenderness to light palpation.  COVID testing obtained and was negative. Lipase not concerning for acute pancreatitis.  Urinalysis showed some pyuria but no definite urinary tract disease.  She does have evidence of trichomonas.  Flagyl 500 mg twice daily for 7 days sent to the pharmacy.  Recommended she follow-up with her primary care provider for test of cure in 3 months.    CBC showed frank leukocytosis with left shift, WBC 18.5. CMP showed mild hypochloremia hyponatremia, NA 132, Cl 97; hypokalemia/K3.4; she has a mild acidosis with bicarb 21; she is hyperglycemic in the setting of diabetes, glucose 167; hypoalbuminemia.  She has some liver function derailments: albumin 3.2 AST 27, ALT 49, alk phos 168 with T. bili 1.4.  I am concerned that she has a an acute intra-abdominal process.  Recommended she be evaluated in the emergency department for advanced imaging and possibly blood cultures.  She may require IV antibiotics.  Pt is stable for discharge to the ED via private vehicle. She plans to drive to [facility] ED. MDM, treatment plan and plan for follow-up with patient who agrees with plan.     Final Clinical  Impressions(s) / UC Diagnoses   Final diagnoses:  Generalized abdominal pain  Bandemia  Microcytic anemia  Tachycardia  Trichomonas infection  Alcohol abuse     Discharge Instructions      Your white blood cell count is too high. Couple with your abdominal pain and diarrhea, you need advanced abdominal imaging and possibly IV antibiotics.  Your COVID test was negative.   You have been advised to follow up immediately in the emergency department for concerning signs or symptoms as discussed during your visit. If you declined EMS transport, please have a family member take you directly to the ED at this time. Do not delay.   Based on concerns about condition, if you do not follow up in the ED, you may risk poor outcomes including worsening of condition, delayed treatment and potentially life threatening issues. If you have declined to go to the ED at this time, you should call your PCP immediately to set up a follow up appointment.      ED Prescriptions     Medication Sig Dispense Auth. Provider   metroNIDAZOLE (FLAGYL) 500 MG tablet Take 1 tablet (500 mg total) by mouth 2 (two) times daily. 14 tablet Myeshia Fojtik, Seward Meth, DO      PDMP not reviewed this encounter.       Katha Cabal, DO 02/19/23 2051

## 2023-02-19 NOTE — Discharge Instructions (Addendum)
Your white blood cell count is too high. Couple with your abdominal pain and diarrhea, you need advanced abdominal imaging and possibly IV antibiotics.  Your COVID test was negative.   You have been advised to follow up immediately in the emergency department for concerning signs or symptoms as discussed during your visit. If you declined EMS transport, please have a family member take you directly to the ED at this time. Do not delay.   Based on concerns about condition, if you do not follow up in the ED, you may risk poor outcomes including worsening of condition, delayed treatment and potentially life threatening issues. If you have declined to go to the ED at this time, you should call your PCP immediately to set up a follow up appointment.

## 2023-02-19 NOTE — ED Notes (Signed)
Patient is being discharged from the Urgent Care and sent to the Emergency Department via POV . Per Dr.Brimage, patient is in need of higher level of care due to abd pain & elevated WBC's. Patient is aware and verbalizes understanding of plan of care.  Vitals:   02/19/23 0942  BP: 122/88  Pulse: (!) 115  Resp: 16  Temp: 99.8 F (37.7 C)  SpO2: 98%

## 2023-02-19 NOTE — ED Triage Notes (Signed)
Pt c/o fatigue,chills,diarrhea & loss of appetite x7 days. Denies abd pain or emesis. No otc tx.

## 2023-06-14 ENCOUNTER — Other Ambulatory Visit: Payer: Self-pay

## 2023-06-14 ENCOUNTER — Emergency Department
Admission: EM | Admit: 2023-06-14 | Discharge: 2023-06-14 | Disposition: A | Discharge status: Home / Self Care | Payer: Commercial Managed Care - PPO | Attending: Emergency Medicine | Admitting: Emergency Medicine

## 2023-06-14 ENCOUNTER — Encounter: Payer: Self-pay | Admitting: Emergency Medicine

## 2023-06-14 DIAGNOSIS — S39012A Strain of muscle, fascia and tendon of lower back, initial encounter: Secondary | ICD-10-CM | POA: Insufficient documentation

## 2023-06-14 DIAGNOSIS — E119 Type 2 diabetes mellitus without complications: Secondary | ICD-10-CM | POA: Insufficient documentation

## 2023-06-14 DIAGNOSIS — Y92410 Unspecified street and highway as the place of occurrence of the external cause: Secondary | ICD-10-CM | POA: Insufficient documentation

## 2023-06-14 DIAGNOSIS — S161XXA Strain of muscle, fascia and tendon at neck level, initial encounter: Secondary | ICD-10-CM | POA: Insufficient documentation

## 2023-06-14 DIAGNOSIS — S199XXA Unspecified injury of neck, initial encounter: Secondary | ICD-10-CM | POA: Diagnosis present

## 2023-06-14 DIAGNOSIS — G44319 Acute post-traumatic headache, not intractable: Secondary | ICD-10-CM | POA: Diagnosis not present

## 2023-06-14 HISTORY — DX: Crohn's disease, unspecified, without complications: K50.90

## 2023-06-14 MED ORDER — IBUPROFEN 600 MG PO TABS
600.0000 mg | ORAL_TABLET | Freq: Once | ORAL | Status: AC
  Administered 2023-06-14: 600 mg via ORAL
  Filled 2023-06-14: qty 1

## 2023-06-14 MED ORDER — IBUPROFEN 600 MG PO TABS: 600.0000 mg | ORAL_TABLET | Freq: Three times a day (TID) | ORAL | 0 refills | Status: AC | PRN

## 2023-06-14 MED ORDER — CYCLOBENZAPRINE HCL 10 MG PO TABS
10.0000 mg | ORAL_TABLET | Freq: Once | ORAL | Status: AC
  Administered 2023-06-14: 10 mg via ORAL
  Filled 2023-06-14: qty 1

## 2023-06-14 MED ORDER — CYCLOBENZAPRINE HCL 5 MG PO TABS: 5.0000 mg | ORAL_TABLET | Freq: Three times a day (TID) | ORAL | 0 refills | Status: AC | PRN
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
## 2023-08-14 ENCOUNTER — Emergency Department
Admission: EM | Admit: 2023-08-14 | Discharge: 2023-08-15 | Disposition: A | Discharge status: Home / Self Care | Payer: Commercial Managed Care - PPO | Attending: Emergency Medicine | Admitting: Emergency Medicine

## 2023-08-14 DIAGNOSIS — M25552 Pain in left hip: Secondary | ICD-10-CM | POA: Insufficient documentation

## 2023-08-14 DIAGNOSIS — Y92410 Unspecified street and highway as the place of occurrence of the external cause: Secondary | ICD-10-CM | POA: Diagnosis not present

## 2023-08-14 HISTORY — DX: Essential (primary) hypertension: I10
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
## 2023-08-15 ENCOUNTER — Emergency Department: Payer: Commercial Managed Care - PPO

## 2023-08-15 MED ORDER — KETOROLAC TROMETHAMINE 60 MG/2ML IM SOLN
30.0000 mg | Freq: Once | INTRAMUSCULAR | Status: AC
  Administered 2023-08-15: 30 mg via INTRAMUSCULAR
  Filled 2023-08-15: qty 2

## 2023-08-15 MED ORDER — AMOXICILLIN-POT CLAVULANATE 600-42.9 MG/5ML PO SUSR: 875.0000 mg | Freq: Once | ORAL | Status: DC

## 2023-08-15 MED ORDER — METHOCARBAMOL 500 MG PO TABS
1000.0000 mg | ORAL_TABLET | Freq: Once | ORAL | Status: AC
  Administered 2023-08-15: 1000 mg via ORAL
  Filled 2023-08-15: qty 2

## 2023-08-15 MED ORDER — LIDOCAINE 5 % EX PTCH
1.0000 | MEDICATED_PATCH | Freq: Once | CUTANEOUS | Status: DC
  Administered 2023-08-15: 1 via TRANSDERMAL
  Filled 2023-08-15: qty 1
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
